# Patient Record
Sex: Female | Born: 1995 | Race: Black or African American | Hispanic: No | Marital: Single | Smoking: Never smoker
Health system: Southern US, Community
[De-identification: ages and names within clinical notes are randomized; demographics above are authoritative.]

## PROBLEM LIST (undated history)

## (undated) DIAGNOSIS — Z789 Other specified health status: Secondary | ICD-10-CM

## (undated) DIAGNOSIS — Z9109 Other allergy status, other than to drugs and biological substances: Secondary | ICD-10-CM

## (undated) HISTORY — PX: NO PAST SURGERIES: SHX2092

---

## 2017-08-10 ENCOUNTER — Ambulatory Visit (HOSPITAL_COMMUNITY)
Admission: EM | Admit: 2017-08-10 | Discharge: 2017-08-10 | Disposition: A | Discharge status: Home / Self Care | Payer: BLUE CROSS/BLUE SHIELD | Attending: Family Medicine | Admitting: Family Medicine

## 2017-08-10 ENCOUNTER — Encounter (HOSPITAL_COMMUNITY): Payer: Self-pay | Admitting: *Deleted

## 2017-08-10 DIAGNOSIS — J301 Allergic rhinitis due to pollen: Secondary | ICD-10-CM | POA: Diagnosis not present

## 2017-08-10 HISTORY — DX: Other allergy status, other than to drugs and biological substances: Z91.09

## 2017-08-10 MED ORDER — FEXOFENADINE HCL 180 MG PO TABS: 180.0000 mg | ORAL_TABLET | Freq: Every day | ORAL | 2 refills | Status: DC

## 2017-08-10 NOTE — ED Triage Notes (Signed)
Has been having allergy-type sxs; reports coughing up some sputum this AM and noticed some blood in it.  Denies having an actual cough.  Denies any weight loss or night sweats.

## 2017-08-10 NOTE — ED Provider Notes (Signed)
[facility]    CSN: [id number] Arrival date & time: 08/10/17  1425     History   Chief Complaint Chief Complaint  Patient presents with  . Hemoptysis    HPI Sara Davies is a 21 y.o. female.   Has been having allergy-type sxs; reports coughing up some sputum this AM and noticed some blood in it.  Denies having an actual cough.  Denies any weight loss or night sweats.  She is also worried she might have contacted an STD from kissing a boy on the lips.  She has some tingling in her lips  Patient has h/o allergies.  She is from [place] where she has been treated for eczema.  She has cream.      Past Medical History:  Diagnosis Date  . Environmental allergies     There are no active problems to display for this patient.   History reviewed. No pertinent surgical history.  OB History    No data available       Home Medications    Prior to Admission medications   Medication Sig Start Date End Date Taking? Authorizing Provider  UNKNOWN TO PATIENT BCPs   Yes [provider]  fexofenadine (ALLEGRA ALLERGY) 180 MG tablet Take 1 tablet (180 mg total) by mouth daily. 08/10/17   Elvina Sidle, MD    Family History No family history on file.  Social History Social History  Substance Use Topics  . Smoking status: Never Smoker  . Smokeless tobacco: Not on file  . Alcohol use No     Allergies   Patient has no known allergies.   Review of Systems Review of Systems  Constitutional: Negative for chills, diaphoresis and fever.  Skin: Positive for rash.  All other systems reviewed and are negative.    Physical Exam Triage Vital Signs ED Triage Vitals  Enc Vitals Group     BP 08/10/17 1457 130/62     Pulse Rate 08/10/17 1455 68     Resp 08/10/17 1455 16     Temp 08/10/17 1455 (!) 97.5 F (36.4 C)     Temp Source 08/10/17 1455 Oral     SpO2 08/10/17 1455 100 %     Weight --      Height --      Head Circumference --      Peak  Flow --      Pain Score 08/10/17 1456 2     Pain Loc --      Pain Edu? --      Excl. in GC? --    No data found.   Updated Vital Signs BP 130/62   Pulse 75   Temp (!) 97.5 F (36.4 C) (Oral)   Resp 16   LMP 08/01/2017 (Exact Date)   SpO2 100%   Visual Acuity Right Eye Distance:   Left Eye Distance:   Bilateral Distance:    Right Eye Near:   Left Eye Near:    Bilateral Near:     Physical Exam  Constitutional: She is oriented to person, place, and time. She appears well-developed and well-nourished.  HENT:  Right Ear: External ear normal.  Left Ear: External ear normal.  Nose: Nose normal.  Mouth/Throat: Oropharynx is clear and moist.  Eyes: Pupils are equal, round, and reactive to light. Conjunctivae and EOM are normal.  Neck: Normal range of motion. Neck supple.  Cardiovascular: Normal rate, regular rhythm and normal heart sounds.   Pulmonary/Chest: Effort normal and breath  sounds normal.  Musculoskeletal: Normal range of motion.  Neurological: She is alert and oriented to person, place, and time.  Skin: Skin is warm. There is erythema.  Mild right antecubital eczema  Nursing note and vitals reviewed.    UC Treatments / Results  Labs (all labs ordered are listed, but only abnormal results are displayed) Labs Reviewed - No data to display  EKG  EKG Interpretation None       Radiology No results found.  Procedures Procedures (including critical care time)  Medications Ordered in UC Medications - No data to display   Initial Impression / Assessment and Plan / UC Course  I have reviewed the triage vital signs and the nursing notes.  Pertinent labs & imaging results that were available during my care of the patient were reviewed by me and considered in my medical decision making (see chart for details).     Final Clinical Impressions(s) / UC Diagnoses   Final diagnoses:  Seasonal allergic rhinitis due to pollen    New Prescriptions New  Prescriptions   FEXOFENADINE (ALLEGRA ALLERGY) 180 MG TABLET    Take 1 tablet (180 mg total) by mouth daily.     Controlled Substance Prescriptions [place] Controlled Substance Registry consulted? Not Applicable   Elvina Sidle, MD 08/10/17 1531

## 2017-08-10 NOTE — Discharge Instructions (Signed)
There are no signs of any infectious disease at this time.

## 2017-10-28 ENCOUNTER — Encounter (HOSPITAL_COMMUNITY): Payer: Self-pay | Admitting: Emergency Medicine

## 2017-10-28 ENCOUNTER — Ambulatory Visit (HOSPITAL_COMMUNITY)
Admission: EM | Admit: 2017-10-28 | Discharge: 2017-10-28 | Disposition: A | Discharge status: Home / Self Care | Payer: BLUE CROSS/BLUE SHIELD | Attending: Physician Assistant | Admitting: Physician Assistant

## 2017-10-28 DIAGNOSIS — J00 Acute nasopharyngitis [common cold]: Secondary | ICD-10-CM | POA: Insufficient documentation

## 2017-10-28 DIAGNOSIS — Z79899 Other long term (current) drug therapy: Secondary | ICD-10-CM | POA: Diagnosis not present

## 2017-10-28 LAB — POCT RAPID STREP A: STREPTOCOCCUS, GROUP A SCREEN (DIRECT): NEGATIVE

## 2017-10-28 MED ORDER — CETIRIZINE-PSEUDOEPHEDRINE ER 5-120 MG PO TB12: 1.0000 | ORAL_TABLET | Freq: Every day | ORAL | 0 refills | Status: DC

## 2017-10-28 MED ORDER — FLUTICASONE PROPIONATE 50 MCG/ACT NA SUSP: 2.0000 | Freq: Every day | NASAL | 0 refills | Status: DC

## 2017-10-28 NOTE — Discharge Instructions (Signed)
Rapid strep negative. Symptoms are most likely due to viral illness. Flonase and/or Zyrtec-D for nasal congestion. You can use over the counter nasal saline rinse such as neti pot for nasal congestion. Monitor for any worsening of symptoms, swelling of the throat, trouble breathing, trouble swallowing, follow up for reevaluation.  ° °For sore throat try using a honey-based tea. Use 3 teaspoons of honey with juice squeezed from half lemon. Place shaved pieces of ginger into 1/2-1 cup of water and warm over stove top. Then mix the ingredients and repeat every 4 hours as needed. °

## 2017-10-28 NOTE — ED Triage Notes (Signed)
PT reports congestion, productive cough, sore throat for 3-4 days.

## 2017-10-28 NOTE — ED Provider Notes (Signed)
[facility]    CSN: [id number] Arrival date & time: 10/28/17  1159     History   Chief Complaint Chief Complaint  Patient presents with  . URI    HPI Sara Davies is a 21 y.o. female.   21 year old female comes in for 4 day history of nasal congestion, productive cough, sore throat. Productive cough that is worse at night. Sore throat is better, though with irritation, dysphia without trouble swallowing, trouble breathing, swelling of the throat. Denies fever, chills, night sweats. Denies ear pain, eye pain. otc cough drops with good improvement. Positive sick contact, states she was exposed to someone with positive strep. Never smoker.       Past Medical History:  Diagnosis Date  . Environmental allergies     There are no active problems to display for this patient.   History reviewed. No pertinent surgical history.  OB History    No data available       Home Medications    Prior to Admission medications   Medication Sig Start Date End Date Taking? Authorizing Provider  fexofenadine (ALLEGRA ALLERGY) 180 MG tablet Take 1 tablet (180 mg total) by mouth daily. 08/10/17  Yes Elvina SidleLauenstein, Kurt, MD  cetirizine-pseudoephedrine (ZYRTEC-D) 5-120 MG tablet Take 1 tablet daily by mouth. 10/28/17   Cathie HoopsYu, Cierria Height V, PA-C  fluticasone (FLONASE) 50 MCG/ACT nasal spray Place 2 sprays daily into both nostrils. 10/28/17   Belinda FisherYu, Malvin Morrish V, PA-C  UNKNOWN TO PATIENT BCPs    [provider]    Family History No family history on file.  Social History Social History   Tobacco Use  . Smoking status: Never Smoker  . Smokeless tobacco: Never Used  Substance Use Topics  . Alcohol use: No  . Drug use: No     Allergies   Patient has no known allergies.   Review of Systems Review of Systems  Reason unable to perform ROS: See HPI as above.     Physical Exam Triage Vital Signs ED Triage Vitals  Enc Vitals Group     BP 10/28/17 1241 (!) 104/50     Pulse Rate  10/28/17 1241 66     Resp 10/28/17 1241 16     Temp 10/28/17 1241 98.9 F (37.2 C)     Temp Source 10/28/17 1241 Oral     SpO2 10/28/17 1241 100 %     Weight 10/28/17 1241 138 lb (62.6 kg)     Height 10/28/17 1241 5\' 4"  (1.626 m)     Head Circumference --      Peak Flow --      Pain Score 10/28/17 1242 6     Pain Loc --      Pain Edu? --      Excl. in GC? --    No data found.  Updated Vital Signs BP (!) 104/50   Pulse 66   Temp 98.9 F (37.2 C) (Oral)   Resp 16   Ht 5\' 4"  (1.626 m)   Wt 138 lb (62.6 kg)   LMP 10/21/2017   SpO2 100%   BMI 23.69 kg/m   Physical Exam  Constitutional: She is oriented to person, place, and time. She appears well-developed and well-nourished. No distress.  HENT:  Head: Normocephalic and atraumatic.  Right Ear: Tympanic membrane, external ear and ear canal normal. Tympanic membrane is not erythematous and not bulging.  Left Ear: Tympanic membrane, external ear and ear canal normal. Tympanic membrane is not erythematous  and not bulging.  Nose: Mucosal edema and rhinorrhea present. Right sinus exhibits maxillary sinus tenderness and frontal sinus tenderness. Left sinus exhibits maxillary sinus tenderness and frontal sinus tenderness.  Mouth/Throat: Uvula is midline and mucous membranes are normal. Posterior oropharyngeal erythema present. Tonsils are 1+ on the right. Tonsils are 1+ on the left. No tonsillar exudate.  Eyes: Conjunctivae are normal. Pupils are equal, round, and reactive to light.  Neck: Normal range of motion. Neck supple.  Cardiovascular: Normal rate, regular rhythm and normal heart sounds. Exam reveals no gallop and no friction rub.  No murmur heard. Pulmonary/Chest: Effort normal and breath sounds normal. She has no decreased breath sounds. She has no wheezes. She has no rhonchi. She has no rales.  Lymphadenopathy:    She has no cervical adenopathy.  Neurological: She is alert and oriented to person, place, and time.  Skin: Skin  is warm and dry.  Psychiatric: She has a normal mood and affect. Her behavior is normal. Judgment normal.     UC Treatments / Results  Labs (all labs ordered are listed, but only abnormal results are displayed) Labs Reviewed  CULTURE, GROUP A STREP [facility]    EKG  EKG Interpretation None       Radiology No results found.  Procedures Procedures (including critical care time)  Medications Ordered in UC Medications - No data to display   Initial Impression / Assessment and Plan / UC Course  I have reviewed the triage vital signs and the nursing notes.  Pertinent labs & imaging results that were available during my care of the patient were reviewed by me and considered in my medical decision making (see chart for details).    Discussed with patient history and exam most consistent with viral URI. Symptomatic treatment as needed. Push fluids. Return precautions given.   Final Clinical Impressions(s) / UC Diagnoses   Final diagnoses:  Acute nasopharyngitis    ED Discharge Orders        Ordered    fluticasone (FLONASE) 50 MCG/ACT nasal spray  Daily     10/28/17 1359    cetirizine-pseudoephedrine (ZYRTEC-D) 5-120 MG tablet  Daily     10/28/17 1359        Belinda FisherYu, Lindsie Simar V, PA-C 10/28/17 1402

## 2017-10-31 LAB — CULTURE, GROUP A STREP (THRC)

## 2017-11-21 ENCOUNTER — Other Ambulatory Visit: Payer: Self-pay

## 2017-11-21 ENCOUNTER — Encounter (HOSPITAL_COMMUNITY): Payer: Self-pay | Admitting: *Deleted

## 2017-11-21 ENCOUNTER — Inpatient Hospital Stay (HOSPITAL_COMMUNITY)
Admission: AD | Admit: 2017-11-21 | Discharge: 2017-11-21 | Disposition: A | Discharge status: Home / Self Care | Payer: BLUE CROSS/BLUE SHIELD | Source: Ambulatory Visit | Attending: Obstetrics and Gynecology | Admitting: Obstetrics and Gynecology

## 2017-11-21 DIAGNOSIS — N949 Unspecified condition associated with female genital organs and menstrual cycle: Secondary | ICD-10-CM | POA: Diagnosis not present

## 2017-11-21 DIAGNOSIS — Z3202 Encounter for pregnancy test, result negative: Secondary | ICD-10-CM

## 2017-11-21 DIAGNOSIS — N939 Abnormal uterine and vaginal bleeding, unspecified: Secondary | ICD-10-CM | POA: Diagnosis present

## 2017-11-21 HISTORY — DX: Other specified health status: Z78.9

## 2017-11-21 LAB — URINALYSIS, ROUTINE W REFLEX MICROSCOPIC
Bilirubin Urine: NEGATIVE
GLUCOSE, UA: NEGATIVE mg/dL
Ketones, ur: NEGATIVE mg/dL
Leukocytes, UA: NEGATIVE
Nitrite: NEGATIVE
PH: 8 (ref 5.0–8.0)
Protein, ur: NEGATIVE mg/dL
SPECIFIC GRAVITY, URINE: 1.017 (ref 1.005–1.030)

## 2017-11-21 LAB — ABO/RH: ABO/RH(D): O POS

## 2017-11-21 LAB — HCG, SERUM, QUALITATIVE: PREG SERUM: NEGATIVE

## 2017-11-21 LAB — POCT PREGNANCY, URINE: Preg Test, Ur: NEGATIVE

## 2017-11-21 NOTE — MAU Note (Signed)
Pt stated she had stopped her BCP at the begining of th month (wanted to get on a different kind) Had a Perio on November 1st . Had unprotected sex on Nov 10 and then had a +HPT 4 days ago. Started bleeding 2 days ago and has had a negative HPT .

## 2017-11-21 NOTE — MAU Provider Note (Signed)
History     CSN: [id number]  Arrival date and time: 11/21/17 1711   None     Chief Complaint  Patient presents with  . Possible Pregnancy  . Vaginal Bleeding   HPI   Ms.Marquita Sherrine MaplesGlenn is a 21 y.o. female G0P0000 @ Unknown here in MAU with concerns about pregnancy. States her last normal period was 11/1. She had a positive pregnancy test this week and then started bleeding 2 days ago. She took another pregnancy test shortly after she started bleeding and it was negative. She denies pain. Has continued to have menstrual like bleeding.    OB History    Gravida Para Term Preterm AB Living   0 0 0 0 0 0   SAB TAB Ectopic Multiple Live Births   0 0 0 0 0      Past Medical History:  Diagnosis Date  . Environmental allergies   . Medical history non-contributory     Past Surgical History:  Procedure Laterality Date  . NO PAST SURGERIES      No family history on file.  Social History   Tobacco Use  . Smoking status: Never Smoker  . Smokeless tobacco: Never Used  Substance Use Topics  . Alcohol use: No  . Drug use: No    Allergies: No Known Allergies  Medications Prior to Admission  Medication Sig Dispense Refill Last Dose  . cetirizine-pseudoephedrine (ZYRTEC-D) 5-120 MG tablet Take 1 tablet daily by mouth. 15 tablet 0   . fexofenadine (ALLEGRA ALLERGY) 180 MG tablet Take 1 tablet (180 mg total) by mouth daily. 14 tablet 2 Past Week at Unknown time  . fluticasone (FLONASE) 50 MCG/ACT nasal spray Place 2 sprays daily into both nostrils. 1 g 0   . UNKNOWN TO PATIENT BCPs      Results for orders placed or performed during the hospital encounter of 11/21/17 (from the past 48 hour(s))  Urinalysis, Routine w reflex microscopic     Status: Abnormal   Collection Time: 11/21/17  5:40 PM  Result Value Ref Range   Color, Urine YELLOW YELLOW   APPearance HAZY (A) CLEAR   Specific Gravity, Urine 1.017 1.005 - 1.030   pH 8.0 5.0 - 8.0   Glucose, UA NEGATIVE NEGATIVE mg/dL    Hgb urine dipstick MODERATE (A) NEGATIVE   Bilirubin Urine NEGATIVE NEGATIVE   Ketones, ur NEGATIVE NEGATIVE mg/dL   Protein, ur NEGATIVE NEGATIVE mg/dL   Nitrite NEGATIVE NEGATIVE   Leukocytes, UA NEGATIVE NEGATIVE   RBC / HPF TOO NUMEROUS TO COUNT 0 - 5 RBC/hpf   WBC, UA 0-5 0 - 5 WBC/hpf   Bacteria, UA FEW (A) NONE SEEN   Squamous Epithelial / LPF 0-5 (A) NONE SEEN   Mucus PRESENT   Pregnancy, urine POC     Status: None   Collection Time: 11/21/17  6:04 PM  Result Value Ref Range   Preg Test, Ur NEGATIVE NEGATIVE    Comment:        THE SENSITIVITY OF THIS METHODOLOGY IS >24 mIU/mL   ABO/Rh     Status: None (Preliminary result)   Collection Time: 11/21/17  6:45 PM  Result Value Ref Range   ABO/RH(D) O POS   hCG, serum, qualitative     Status: None   Collection Time: 11/21/17  6:45 PM  Result Value Ref Range   Preg, Serum NEGATIVE NEGATIVE    Comment:        THE SENSITIVITY OF THIS METHODOLOGY IS >10 mIU/mL.  Review of Systems  Constitutional: Negative for fever.  Gastrointestinal: Negative for abdominal pain.  Genitourinary: Positive for vaginal bleeding.   Physical Exam   Blood pressure 139/70, pulse 84, temperature 98.5 F (36.9 C), temperature source Oral, resp. rate 18, height 5\' 4"  (1.626 m), weight 137 lb 12 oz (62.5 kg), last menstrual period 10/23/2017, SpO2 100 %.  Physical Exam  Constitutional: She is oriented to person, place, and time. She appears well-developed and well-nourished. No distress.  HENT:  Head: Normocephalic.  Respiratory: Effort normal.  Musculoskeletal: Normal range of motion.  Neurological: She is alert and oriented to person, place, and time.  Skin: Skin is warm. She is not diaphoretic.  Psychiatric: Her behavior is normal.    MAU Course  Procedures  None  MDM  UA UPT negative Beta hcg level ordered   Assessment and Plan   A:  1. Encounter for pregnancy test with result negative   2. Menstrual symptom or sign      P:  Discharge home in stable condition At home pregnancy tests encouraged Bleeding likely menstrual cycle  Return to MAU for emergencies only  Duane Lopeasch, Garek Schuneman I, NP 11/21/2017 7:38 PM

## 2017-11-21 NOTE — MAU Note (Signed)
Pt presents with c/o +UPT 4 days ago, had heavy VB 2 days after UPT, continues to have VB.  Took another  UPT yesterday, UPT now negative.  Pt reports unsure if still pregnany or possible miscarriage.  Currently no abdominal pain or cramping

## 2017-11-21 NOTE — Discharge Instructions (Signed)
Pregnancy Test Information What is a pregnancy test? A pregnancy test is used to detect the presence of human chorionic gonadotropin (hCG) in a sample of your urine or blood. hCG is a hormone produced by the cells of the placenta. The placenta is the organ that forms to nourish and support a developing baby. This test requires a sample of either blood or urine. A pregnancy test determines whether you are pregnant or not. How are pregnancy tests done? Pregnancy tests are done using a home pregnancy test or having a blood or urine test done at your health care provider's office. Home pregnancy tests require a urine sample.  Most kits use a plastic testing device with a strip of paper that indicates whether there is hCG in your urine.  Follow the test instructions very carefully.  After you urinate on the test stick, markings will appear to let you know whether you are pregnant.  For best results, use your first urine of the morning. That is when the concentration of hCG is highest.  Having a blood test to check for pregnancy requires a sample of blood drawn from a vein in your hand or arm. Your health care provider will send your sample to a lab for testing. Results of a pregnancy test will be positive or negative. Is one type of pregnancy test better than another? In some cases, a blood test will return a positive result even if a urine test was negative because blood tests are more sensitive. This means blood tests can detect hCG earlier than home pregnancy tests. How accurate are home pregnancy tests? Both types of pregnancy tests are very accurate.  A blood test is about 98% accurate.  When you are far enough along in your pregnancy and when used correctly, home pregnancy tests are equally accurate.  Can anything interfere with home pregnancy test results It is possible for certain conditions to cause an inaccurate test result (false positive or falsenegative).  A false positive is a  positive test result when you are not pregnant. This can happen if you: ? Are taking certain medicines, including anticonvulsants or tranquilizers. ? Have certain proteins in your blood.  A false negative is a negative test result when you are pregnant. This can happen if you: ? Took the test before there was enough hCG to detect. A pregnancy test will not be positive in most women until 3-4 weeks after conception. ? Drank a lot of liquid before the test. Diluted urine samples can sometimes give an inaccurate result. ? Take certain medicines, such as water pills (diuretics) or some antihistamines.  What should I do if I have a positive pregnancy test? If you have a positive pregnancy test, schedule an appointment with your health care provider. You might need additional testing to confirm the pregnancy. In the meantime, begin taking a prenatal vitamin, stop smoking, stop drinking alcohol, and do not use street drugs. Talk to your health care provider about how to take care of yourself during your pregnancy. Ask about what to expect from the care you will need throughout pregnancy (prenatal care). This information is not intended to replace advice given to you by your health care provider. Make sure you discuss any questions you have with your health care provider. Document Released: 12/12/2003 Document Revised: 11/05/2016 Document Reviewed: 04/05/2014 Elsevier Interactive Patient Education  2017 [employer] Inc.  Human Chorionic Gonadotropin Test Human chorionic gonadotropin (hCG) is a hormone produced during pregnancy by the cells that form the placenta.  The placenta is the organ that grows inside your womb (uterus) to nourish a developing baby. When you are pregnant, hCG starts to appear in your blood about 11 days after conception. It continues to go up for the first 8-11 weeks of pregnancy. Your hCG level can be measured with several different types of tests. You may have:  A urine test. ? hCG  is eliminated from your body by your kidneys, so a urine test is one way to check for this hormone. ? A urine test only shows whether there is hCG in your urine. It does not measure how much. ? You may have a urine test to find out whether you are pregnant. ? A home pregnancy test detects whether there is hCG in your urine.  A qualitative blood test. ? Like the urine test, this blood test only shows whether there is hCG in your blood. It does not measure how much. ? You may have this type of blood test to find out whether you are pregnant.  A quantitative blood test. ? This type of blood test measures the amount of hCG in your blood. ? You may have this type of test to diagnose an abnormal pregnancy or determine whether you are at risk of, or have had, a failed pregnancy (miscarriage).  How do I prepare for this test? For the urine test:  Limit your fluid intake before the urine test as directed by your health care provider.  Collect the sample the first time you urinate in the morning.  Let your health care provider know if you have blood in your urine. This may interfere with the test result.  Some medicines may interfere with the urine and blood tests. Let your health care provider know about all the medicines you are taking. No additional preparation is required for the blood test. What do the results mean? It is your responsibility to obtain your test results. Ask the lab or department performing the test when and how you will get your results. Talk to your health care provider if you have any questions about your test results. The results of the hCG urine test and the qualitative hCG blood test are either positive or negative. The results of the quantitative hCG blood test are reported as a number. hCG is measured in international units per liter (IU/L). Meaning of Negative Test Results A negative result on a urine or qualitative blood test could mean that you are not pregnant. It  could also mean the test was done too early to detect hCG. If you still have other signs of pregnancy, the test should be repeated. Meaning of Positive Test Results A positive result on the urine or qualitative blood tests means you are most likely pregnant. Your health care provider may confirm your pregnancy with an imaging study of the inside of your uterus at 5-6 weeks (ultrasound). Range of Normal Values Ranges for normal values for the quantitative hCG blood test may vary among different labs and hospitals. You should always check with your health care provider after having lab work or other tests done to discuss whether your values are considered within normal limits.  Less than 5 IU/L means it is most likely you are not pregnant.  Greater than 25 IU/L means it is most likely you are pregnant.  Meaning of Results Outside Normal Value Ranges If your hCG level on the quantitative test is not what would be expected, you may have the test again. It may also  be important for your health care provider to know whether your hCG level goes up or down over time. Common causes of results outside the normal range include:  Being pregnant with twins (hCG level is higher than expected).  Having an ectopic pregnancy (hCG rises more slowly than expected).  Miscarriage (hCG level falls).  Abnormal growths in the womb (hCG level is higher than expected).  Talk with your health care provider to discuss your results, treatment options, and if necessary, the need for more tests. Talk with your health care provider if you have any questions about your results. This information is not intended to replace advice given to you by your health care provider. Make sure you discuss any questions you have with your health care provider. Document Released: 01/10/2005 Document Revised: 08/14/2016 Document Reviewed: 03/15/2014 Elsevier Interactive Patient Education  [employer]2018 [employer]

## 2018-04-13 ENCOUNTER — Ambulatory Visit (HOSPITAL_COMMUNITY)
Admission: EM | Admit: 2018-04-13 | Discharge: 2018-04-13 | Disposition: A | Discharge status: Home / Self Care | Payer: BLUE CROSS/BLUE SHIELD | Attending: Family Medicine | Admitting: Family Medicine

## 2018-04-13 ENCOUNTER — Encounter (HOSPITAL_COMMUNITY): Payer: Self-pay | Admitting: *Deleted

## 2018-04-13 DIAGNOSIS — R21 Rash and other nonspecific skin eruption: Secondary | ICD-10-CM | POA: Diagnosis not present

## 2018-04-13 MED ORDER — PREDNISONE 20 MG PO TABS: 40.0000 mg | ORAL_TABLET | Freq: Every day | ORAL | 0 refills | Status: AC

## 2018-04-13 MED ORDER — CETIRIZINE-PSEUDOEPHEDRINE ER 5-120 MG PO TB12: 1.0000 | ORAL_TABLET | Freq: Every day | ORAL | 0 refills | Status: AC

## 2018-04-13 NOTE — Discharge Instructions (Signed)
No alarming signs on exam. Start zyrtec as directed for itching, I called in zyrtec-D given current seasonal allergy symptoms. Start prednisone as directed. Refrain from scratching.  Monitor for any worsening symptoms, shortness of breath, wheezing, swelling of the throat, swelling of the tongue, drooling, leaning forward to breathe, go to the emergency department for further evaluation.

## 2018-04-13 NOTE — ED Provider Notes (Signed)
[facility]    CSN: [id number] Arrival date & time: 04/13/18  1039     History   Chief Complaint Chief Complaint  Patient presents with  . Medication Reaction    HPI Sara Davies is a 22 y.o. female.   22 year old female comes in for possible allergic reaction to medication.  States that she was having allergy symptoms, and took a friend's Allegra-D 2 days ago.  States shortly after taking the medicine, she felt pruritus throughout the body and rash to the face with some swelling.  She denies any shortness of breath, wheezing, swelling of the throat, swelling of the tongue.  Symptoms have since improved, but she is still having rash to the face with itching.  Denies fever, chills, night sweats.  Denies other new exposures/contacts.  States she has taken Zyrtec-D in the past without problems, but has never taken Allegra before.     Past Medical History:  Diagnosis Date  . Environmental allergies   . Medical history non-contributory     There are no active problems to display for this patient.   Past Surgical History:  Procedure Laterality Date  . NO PAST SURGERIES      OB History    Gravida  0   Para  0   Term  0   Preterm  0   AB  0   Living  0     SAB  0   TAB  0   Ectopic  0   Multiple  0   Live Births  0            Home Medications    Prior to Admission medications   Medication Sig Start Date End Date Taking? Authorizing Provider  cetirizine-pseudoephedrine (ZYRTEC-D) 5-120 MG tablet Take 1 tablet by mouth daily. 04/13/18   Cathie Hoops, Larena Ohnemus V, PA-C  predniSONE (DELTASONE) 20 MG tablet Take 2 tablets (40 mg total) by mouth daily for 5 days. 04/13/18 04/18/18  Belinda Fisher, PA-C  UNKNOWN TO PATIENT BCPs    [provider]    Family History Family History  Problem Relation Age of Onset  . Healthy Mother   . Healthy Father     Social History Social History   Tobacco Use  . Smoking status: Never Smoker  . Smokeless tobacco:  Never Used  Substance Use Topics  . Alcohol use: No  . Drug use: No     Allergies   Patient has no known allergies.   Review of Systems Review of Systems  Reason unable to perform ROS: See HPI as above.     Physical Exam Triage Vital Signs ED Triage Vitals [04/13/18 1142]  Enc Vitals Group     BP 125/72     Pulse Rate 75     Resp 16     Temp 98.6 F (37 C)     Temp Source Oral     SpO2 100 %     Weight      Height      Head Circumference      Peak Flow      Pain Score      Pain Loc      Pain Edu?      Excl. in GC?    No data found.  Updated Vital Signs BP 125/72 (BP Location: Left Arm)   Pulse 75   Temp 98.6 F (37 C) (Oral)   Resp 16   LMP 03/13/2018   SpO2 100%  Physical Exam  Constitutional: She is oriented to person, place, and time. She appears well-developed and well-nourished.  Non-toxic appearance. She does not appear ill. No distress.  HENT:  Head: Normocephalic and atraumatic.  Mouth/Throat: Uvula is midline, oropharynx is clear and moist and mucous membranes are normal. No tonsillar exudate.  Eyes: Pupils are equal, round, and reactive to light. Conjunctivae, EOM and lids are normal.  Cardiovascular: Normal rate, regular rhythm and normal heart sounds. Exam reveals no gallop and no friction rub.  No murmur heard. Pulmonary/Chest: Effort normal and breath sounds normal. No accessory muscle usage or stridor. No respiratory distress. She has no decreased breath sounds. She has no wheezes. She has no rhonchi. She has no rales.  Neurological: She is alert and oriented to person, place, and time.  Skin:  Maculopapular rash to the right cheek adjacent to the nose. No erythema, increased warmth. No pain on palpation. No other rash appreciated.      UC Treatments / Results  Labs (all labs ordered are listed, but only abnormal results are displayed) Labs Reviewed - No data to display  EKG None Radiology No results  found.  Procedures Procedures (including critical care time)  Medications Ordered in UC Medications - No data to display   Initial Impression / Assessment and Plan / UC Course  I have reviewed the triage vital signs and the nursing notes.  Pertinent labs & imaging results that were available during my care of the patient were reviewed by me and considered in my medical decision making (see chart for details).    Patient speaking in full sentences, sitting comfortably without any acute distress.  She is without tachycardia, tachypnea.  Lungs clear to auscultation bilaterally without adventitious lung sounds.  Will have patient take Zyrtec-D for allergy, as well as itching.  Prednisone as directed.  Return precautions given.  Patient expresses understanding and agrees to plan.  Final Clinical Impressions(s) / UC Diagnoses   Final diagnoses:  Rash    ED Discharge Orders        Ordered    cetirizine-pseudoephedrine (ZYRTEC-D) 5-120 MG tablet  Daily     04/13/18 1158    predniSONE (DELTASONE) 20 MG tablet  Daily     04/13/18 1158        Belinda FisherYu, Makoa Satz V, [place]PA-C 04/13/18 1205

## 2018-04-13 NOTE — ED Triage Notes (Signed)
Patient states that she took an Financial controllerallergra D 2 days ago-took 2. States she feels like she has a rash to face, face is swollen, and hands had white patches. Patient states bumps on had have went away but her face burns and is itchy still with a rash.

## 2020-04-06 ENCOUNTER — Ambulatory Visit: Payer: Self-pay | Attending: Family

## 2020-04-06 DIAGNOSIS — Z23 Encounter for immunization: Secondary | ICD-10-CM

## 2020-04-06 NOTE — Progress Notes (Signed)
   Covid-19 Vaccination Clinic  Name:  Sara Davies    MRN: [id number] DOB: [birth year]  04/06/2020  Sara Davies was observed post Covid-19 immunization for 15 minutes without incident. She was provided with Vaccine Information Sheet and instruction to access the V-Safe system.   Sara Davies was instructed to call 911 with any severe reactions post vaccine: Marland Kitchen Difficulty breathing  . Swelling of face and throat  . A fast heartbeat  . A bad rash all over body  . Dizziness and weakness   Immunizations Administered    Name Date Dose VIS Date Route   Moderna COVID-19 Vaccine 04/06/2020  3:54 PM 0.5 mL 11/23/2019 Intramuscular   Manufacturer: Moderna   Lot: [id number]   NDC: 18984-210-31

## 2020-05-09 ENCOUNTER — Ambulatory Visit: Payer: Self-pay | Attending: Family

## 2020-05-09 DIAGNOSIS — Z23 Encounter for immunization: Secondary | ICD-10-CM

## 2020-05-09 NOTE — Progress Notes (Signed)
   Covid-19 Vaccination Clinic  Name:  Sara Davies    MRN: [id number] DOB: [birth year]  05/09/2020  [name] was observed post Covid-19 immunization for 15 minutes without incident. She was provided with Vaccine Information Sheet and instruction to access the V-Safe system.   [name] was instructed to call 911 with any severe reactions post vaccine: Marland Kitchen Difficulty breathing  . Swelling of face and throat  . A fast heartbeat  . A bad rash all over body  . Dizziness and weakness   Immunizations Administered    Name Date Dose VIS Date Route   Moderna COVID-19 Vaccine 05/09/2020  1:20 PM 0.5 mL 11/2019 Intramuscular   Manufacturer: Moderna   Lot: [id number]   NDC: 58346-219-47

## 2020-07-03 ENCOUNTER — Other Ambulatory Visit: Payer: Self-pay

## 2020-07-03 ENCOUNTER — Emergency Department (HOSPITAL_COMMUNITY)
Admission: EM | Admit: 2020-07-03 | Discharge: 2020-07-03 | Disposition: A | Discharge status: Home / Self Care | Payer: Self-pay | Attending: Emergency Medicine | Admitting: Emergency Medicine

## 2020-07-03 ENCOUNTER — Encounter (HOSPITAL_COMMUNITY): Payer: Self-pay

## 2020-07-03 DIAGNOSIS — Z5321 Procedure and treatment not carried out due to patient leaving prior to being seen by health care provider: Secondary | ICD-10-CM | POA: Insufficient documentation

## 2020-07-03 DIAGNOSIS — R519 Headache, unspecified: Secondary | ICD-10-CM | POA: Insufficient documentation

## 2020-07-03 DIAGNOSIS — H9201 Otalgia, right ear: Secondary | ICD-10-CM | POA: Insufficient documentation

## 2020-07-03 NOTE — ED Notes (Signed)
Pt called X 3. No answer. Pt will be moved OTF. 
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
## 2020-07-04 ENCOUNTER — Emergency Department (HOSPITAL_COMMUNITY): Payer: BC Managed Care – PPO

## 2020-07-04 ENCOUNTER — Encounter (HOSPITAL_COMMUNITY): Payer: Self-pay

## 2020-07-04 ENCOUNTER — Emergency Department (HOSPITAL_COMMUNITY)
Admission: EM | Admit: 2020-07-04 | Discharge: 2020-07-04 | Disposition: A | Discharge status: Home / Self Care | Payer: BC Managed Care – PPO | Attending: Emergency Medicine | Admitting: Emergency Medicine

## 2020-07-04 DIAGNOSIS — Y939 Activity, unspecified: Secondary | ICD-10-CM | POA: Diagnosis not present

## 2020-07-04 DIAGNOSIS — R519 Headache, unspecified: Secondary | ICD-10-CM | POA: Diagnosis not present

## 2020-07-04 DIAGNOSIS — S0990XA Unspecified injury of head, initial encounter: Secondary | ICD-10-CM | POA: Diagnosis not present

## 2020-07-04 DIAGNOSIS — Y929 Unspecified place or not applicable: Secondary | ICD-10-CM | POA: Diagnosis not present

## 2020-07-04 DIAGNOSIS — H6691 Otitis media, unspecified, right ear: Secondary | ICD-10-CM | POA: Insufficient documentation

## 2020-07-04 DIAGNOSIS — Y999 Unspecified external cause status: Secondary | ICD-10-CM | POA: Diagnosis not present

## 2020-07-04 DIAGNOSIS — H60501 Unspecified acute noninfective otitis externa, right ear: Secondary | ICD-10-CM

## 2020-07-04 DIAGNOSIS — H9201 Otalgia, right ear: Secondary | ICD-10-CM | POA: Diagnosis present

## 2020-07-04 DIAGNOSIS — H6091 Unspecified otitis externa, right ear: Secondary | ICD-10-CM | POA: Diagnosis not present

## 2020-07-04 MED ORDER — ACETAMINOPHEN 500 MG PO TABS
500.0000 mg | ORAL_TABLET | Freq: Once | ORAL | Status: AC
  Administered 2020-07-04: 02:00:00 500 mg via ORAL
  Filled 2020-07-04: qty 1

## 2020-07-04 MED ORDER — IBUPROFEN 200 MG PO TABS
600.0000 mg | ORAL_TABLET | Freq: Once | ORAL | Status: AC
  Administered 2020-07-04: 03:00:00 600 mg via ORAL
  Filled 2020-07-04: qty 3
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
## 2021-09-01 IMAGING — CT CT TEMPORAL BONES W/O CM
2 series · 12 of 40 positions shown, 15 images · non-contrast
Comparison: Prior head CT from earlier the same day.

CLINICAL DATA: Initial evaluation for head trauma, diagnosed with
otitis externa.

EXAM:
CT TEMPORAL BONES WITHOUT CONTRAST
TECHNIQUE: Axial and coronal plane CT imaging of the petrous temporal bones was
performed with thin-collimation image reconstruction. No intravenous
contrast was administered. Multiplanar CT image reconstructions were
also generated.

[Series 4: axial soft · axial · 0.33mm/px · z∈[-76,-12]mm · 9 of 38 slices shown, 12 images]
[im 3/38  brain]
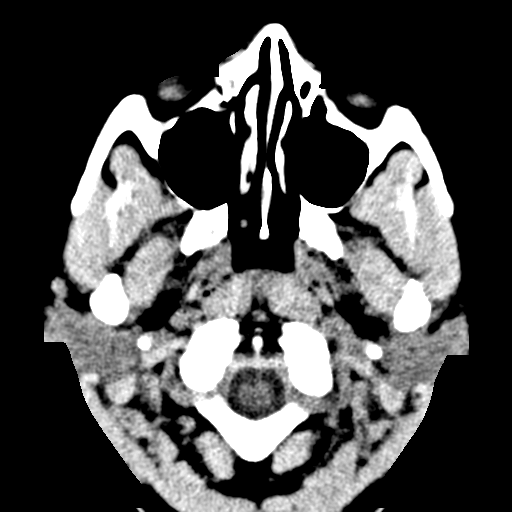
[im 3/38  bone]
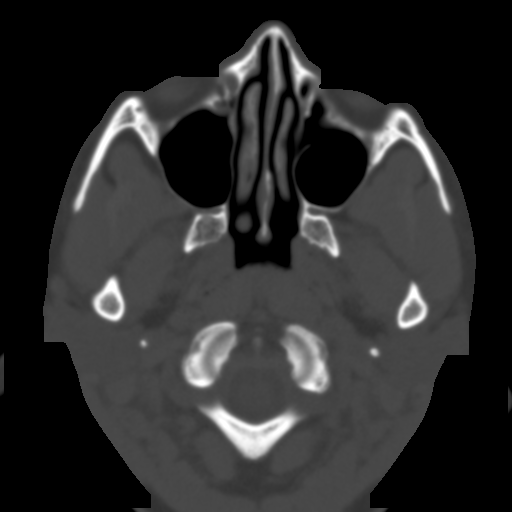
[im 7/38  bone]
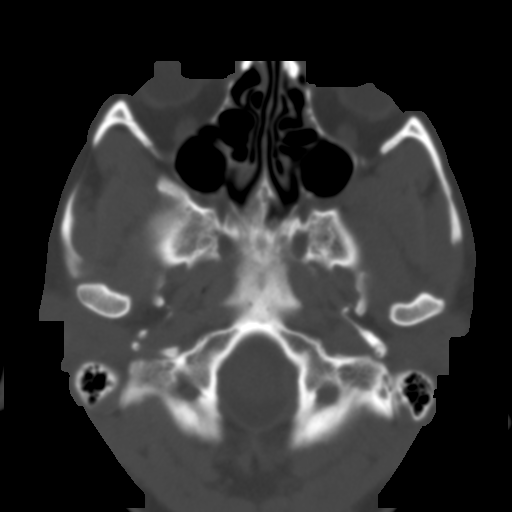
[im 11/38  bone]
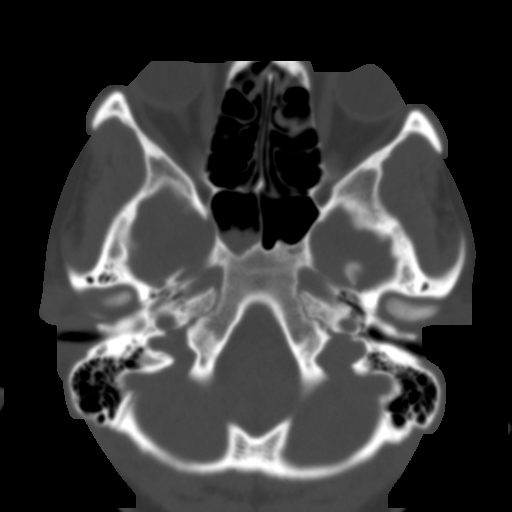
[im 15/38  bone]
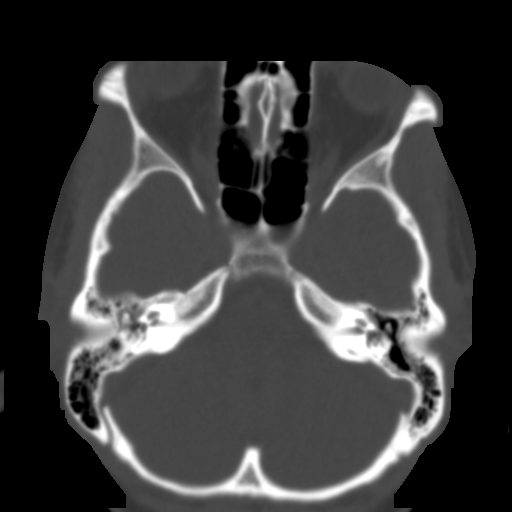
[im 20/38  brain]
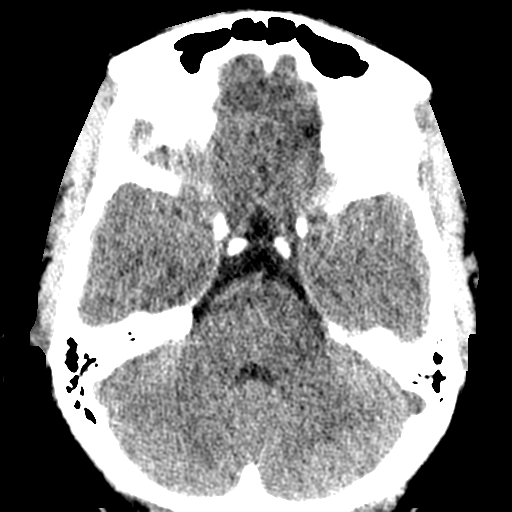
[im 20/38  bone]
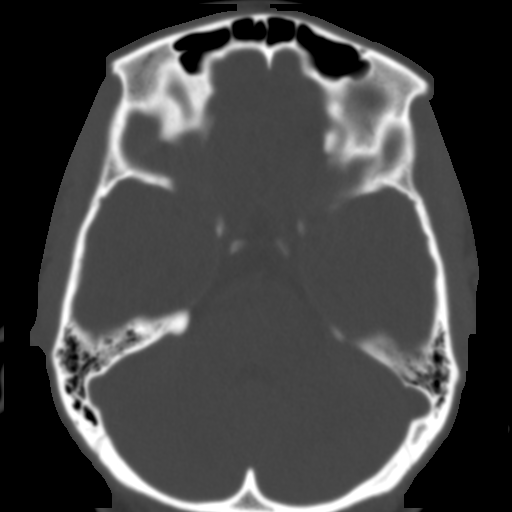
[im 23/38  bone]
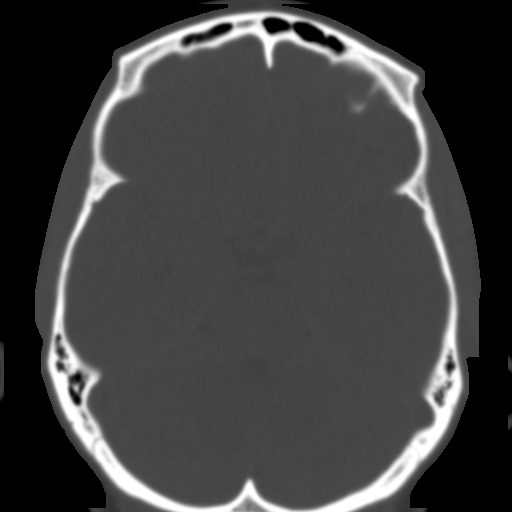
[im 27/38  bone]
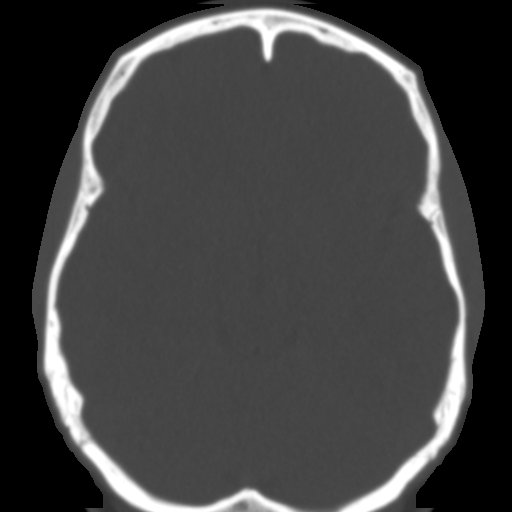
[im 31/38  bone]
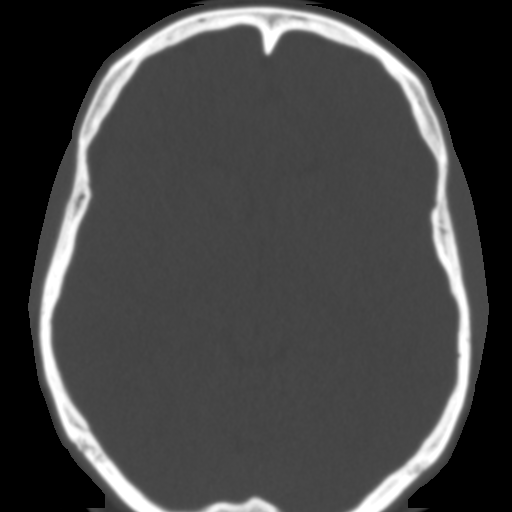
[im 35/38  brain]
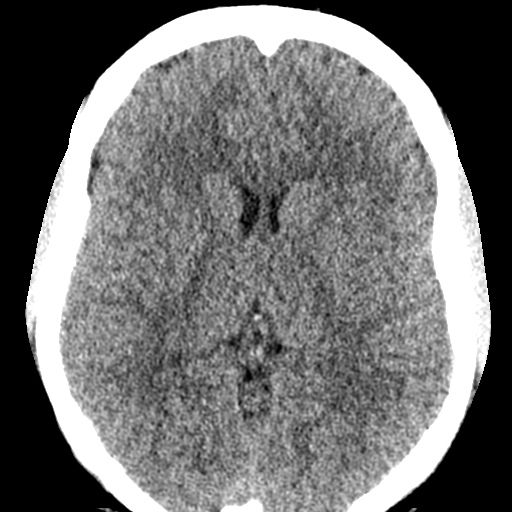
[im 35/38  bone]
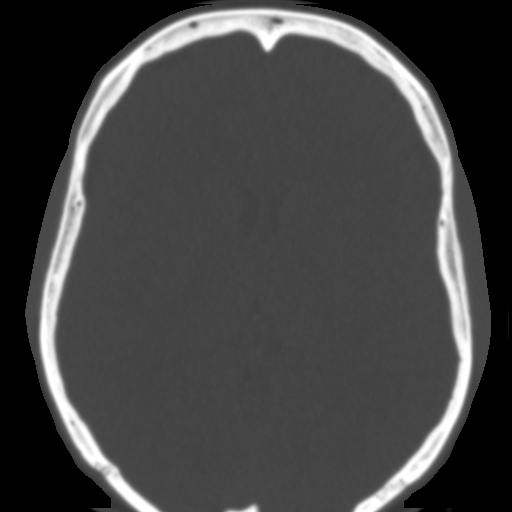

[Series 5: coronal st · coronal · 0.15mm/px · 3 of 79 slices shown]
[im 27/79  bone]
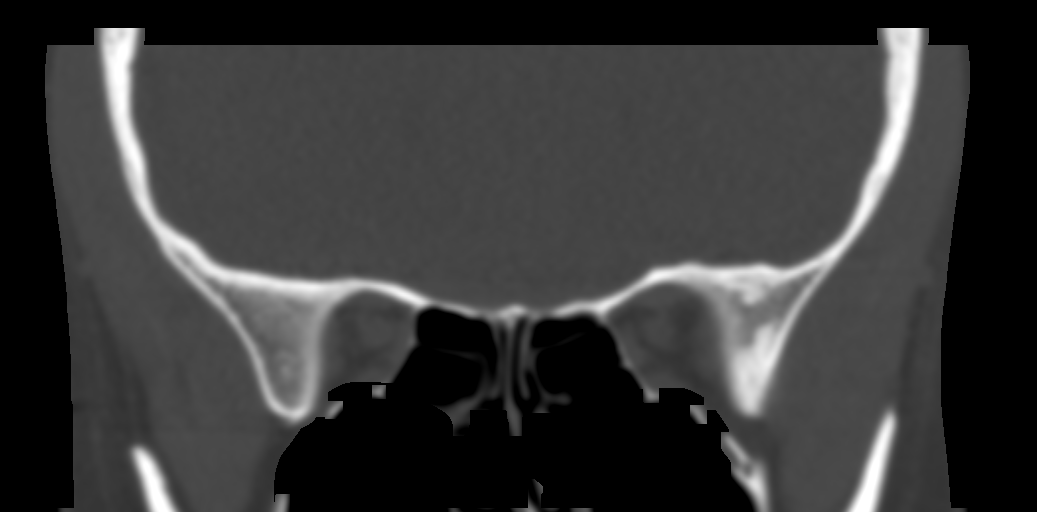
[im 35/79  bone]
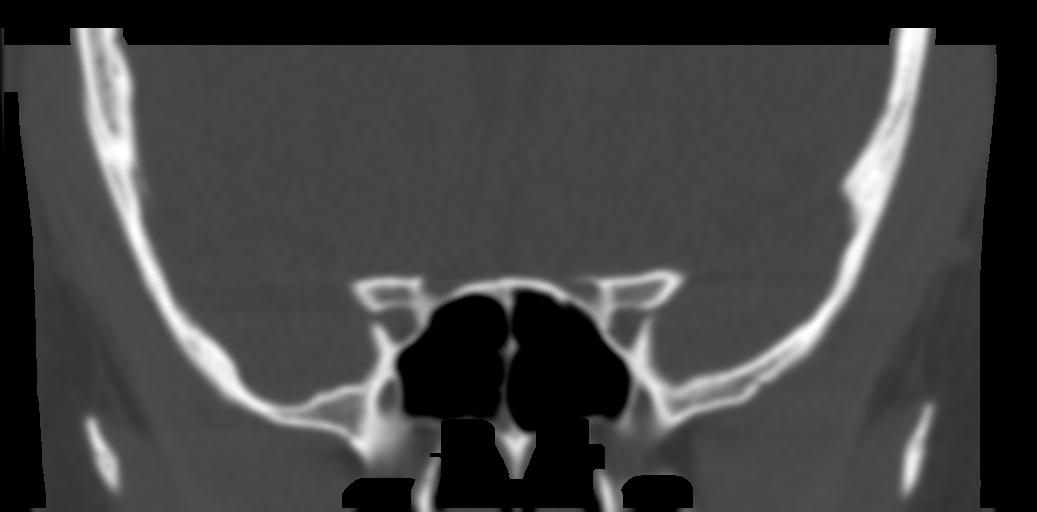
[im 44/79  bone]
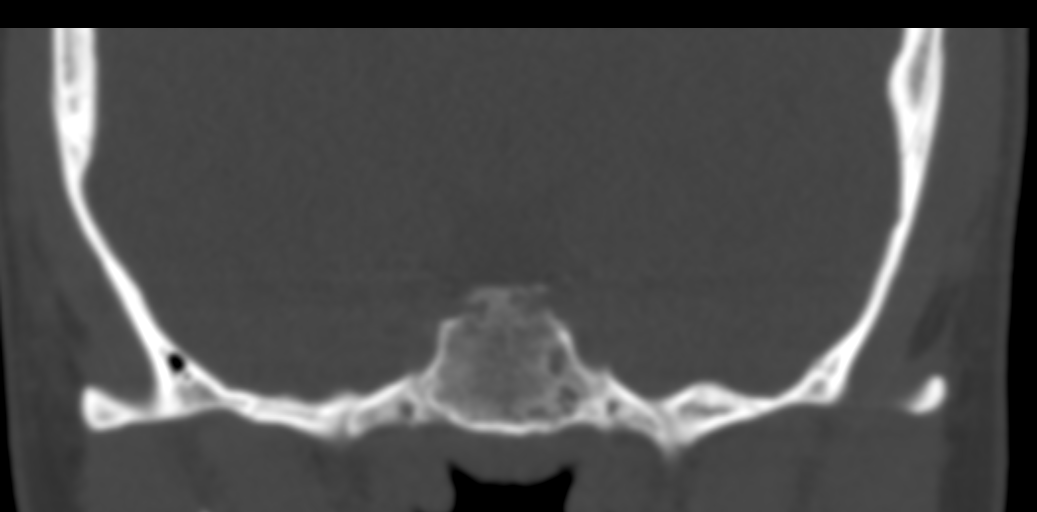

[12 of 40 positions shown; findings below may reference images not displayed]

FINDINGS: RIGHT TEMPORAL BONE:

Mild soft tissue swelling seen involving the right pre and
postauricular soft tissues, asymmetric as compared to the left. The
auricle and pinna itself are within normal limits. Mild soft tissue
fullness extends into the right external auditory canal. Soft tissue
opacity which could reflect fluid and/or debris seen within the deep
aspect of the right EAC. Right tympanic membrane appears thickened
and irregular. Partial opacification of the right middle ear cavity,
primarily involving the mesotympanum and hypotympanum. No osseous
erosion. Scutum intact. Ossicular chain normally formed and intact.
Trace fluid opacity noted within the right mastoid air cells. No
visible fracture. Tympanic and sigmoid plates are intact. Internal
auditory canal within normal limits. Inner ear structures including
the vestibule, semi circular canals, and cochlea intact and normal.
Facial nerve canal intact and within normal limits. Carotid canal
intact. Jugular bulb intact and within normal limits.

LEFT TEMPORAL BONE:

Pre and postauricular soft tissues within normal limits. Auricle and
pinna are normal. External auditory canal clear. Tympanic membrane
thin and intact. Middle ear cavity clear. Ossicular chain normally
formed and intact. Mastoid air cells are clear. Tympanic and sigmoid
plates intact. No fracture. Internal auditory canal normal. Inner
ear structures including the vestibule, semi circular canals, and
cochlea within normal limits. Facial nerve canal intact. Carotid
canal intact and normal. Jugular bulb intact.

Visualized intracranial contents within normal limits. Globes and
orbital soft tissues demonstrate no acute finding. Acute right
sphenoid sinusitis noted, likely allergic/inflammatory nature. Mild
mucosal thickening noted within the ethmoidal air cells and
visualized maxillary sinuses as well.
IMPRESSION: 1. No evidence for acute fracture or other traumatic injury about
either temporal bone.
2. Asymmetric soft tissue swelling involving the right pre and
postauricular soft tissues extending into the right EAC. Associated
thickening and irregularity of the right tympanic membrane with
partial opacification of the right middle ear cavity. Constellation
of findings most suggestive of acute otitis externa with otitis
media, corresponding with the provided patient history.
3. Acute right sphenoid sinusitis, likely allergic/inflammatory in
nature.
